# Patient Record
Sex: Male | Born: 1965 | Race: White | Hispanic: No | Marital: Married
Health system: Southern US, Community
[De-identification: ages and names within clinical notes are randomized; demographics above are authoritative.]

---

## 2000-08-10 ENCOUNTER — Ambulatory Visit (HOSPITAL_BASED_OUTPATIENT_CLINIC_OR_DEPARTMENT_OTHER): Admission: RE | Admit: 2000-08-10 | Discharge: 2000-08-10 | Payer: Self-pay | Admitting: Otolaryngology

## 2003-11-23 ENCOUNTER — Ambulatory Visit (HOSPITAL_COMMUNITY): Admission: RE | Admit: 2003-11-23 | Discharge: 2003-11-24 | Payer: Self-pay | Admitting: Otolaryngology

## 2004-03-28 ENCOUNTER — Ambulatory Visit (HOSPITAL_BASED_OUTPATIENT_CLINIC_OR_DEPARTMENT_OTHER): Admission: RE | Admit: 2004-03-28 | Discharge: 2004-03-28 | Payer: Self-pay | Admitting: Otolaryngology

## 2005-01-20 ENCOUNTER — Encounter: Admission: RE | Admit: 2005-01-20 | Discharge: 2005-01-20 | Payer: Self-pay | Admitting: Family Medicine

## 2007-02-21 IMAGING — US US ABDOMEN COMPLETE
1 series · 14 of 25 positions shown · non-contrast
Comparison: none

CLINICAL DATA: Elevated LFTs.
 ULTRASOUND OF THE ABDOMEN:
 Scans over the upper abdomen were performed.  The gallbladder is well seen and no gallstones are noted.  The liver does have increased echogenicity consistent with fatty infiltration.  The common bile duct is normal measuring between 2.3 and 3.8 mm in diameter.  Evaluation of the IVC and pancreas is limited by bowel gas.  The midportion of the pancreas appears normal.  The spleen is normal in size.  No hydronephrosis is seen.  The right kidney measures 11.4 cm sagittally with the left kidney measuring 11.5 cm.  Assessment of the abdominal aorta is limited by bowel gas and patient body habitus.

[Series 1: us abdomen complete · 0.43mm/px · 14 of 64 slices shown]
[im 1/64]
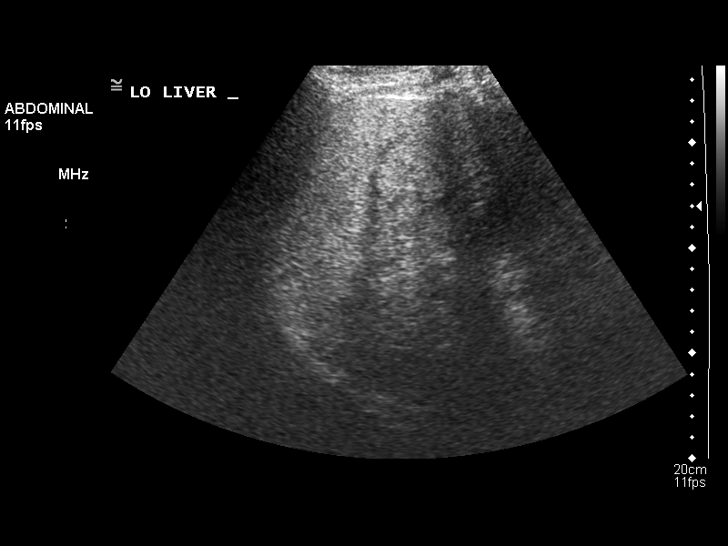
[im 6/64]
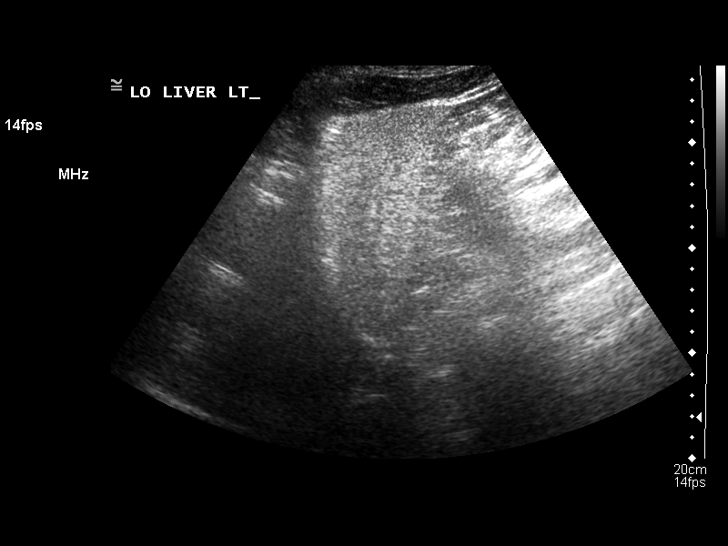
[im 11/64]
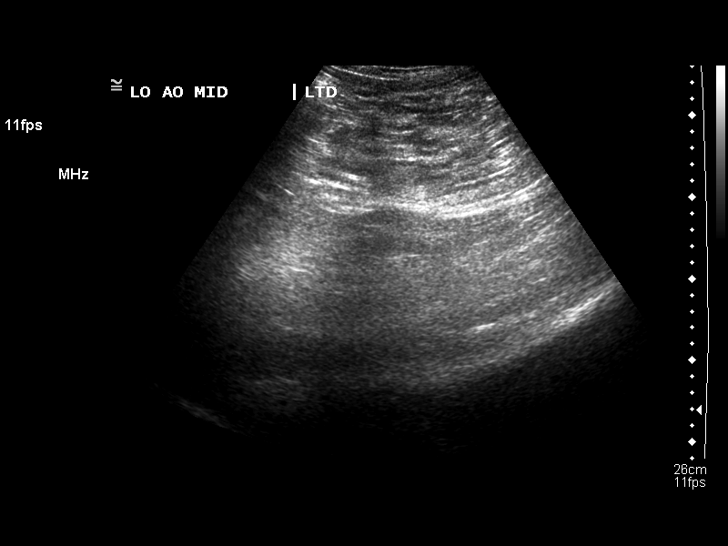
[im 16/64]
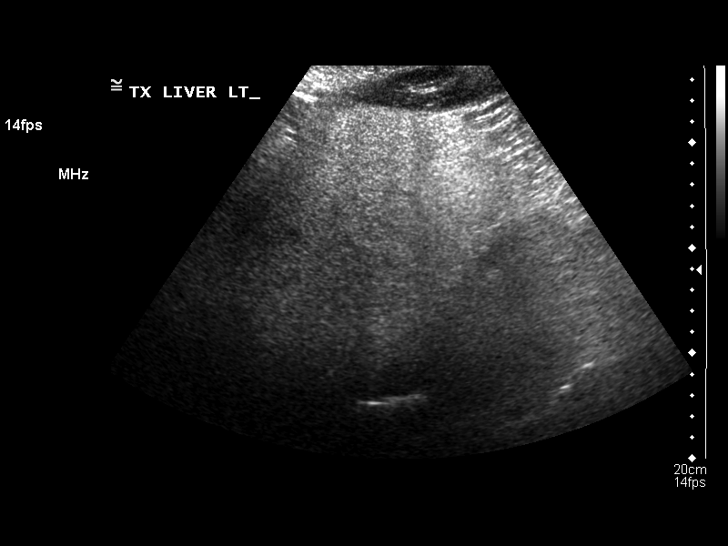
[im 22/64]
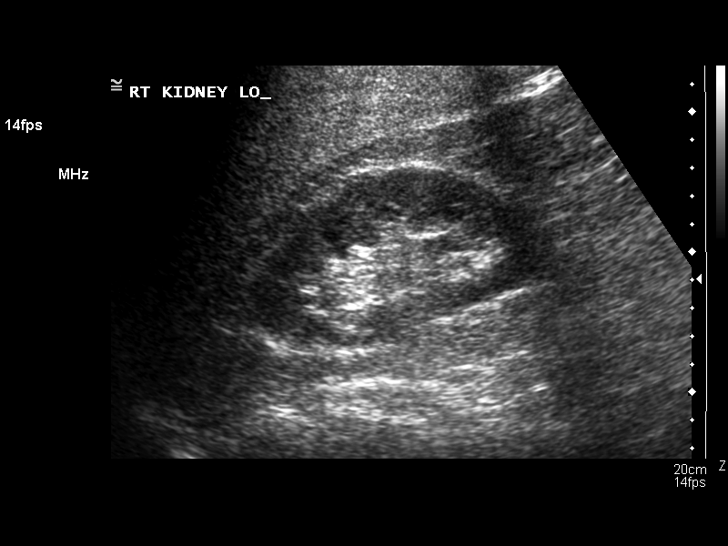
[im 24/64]
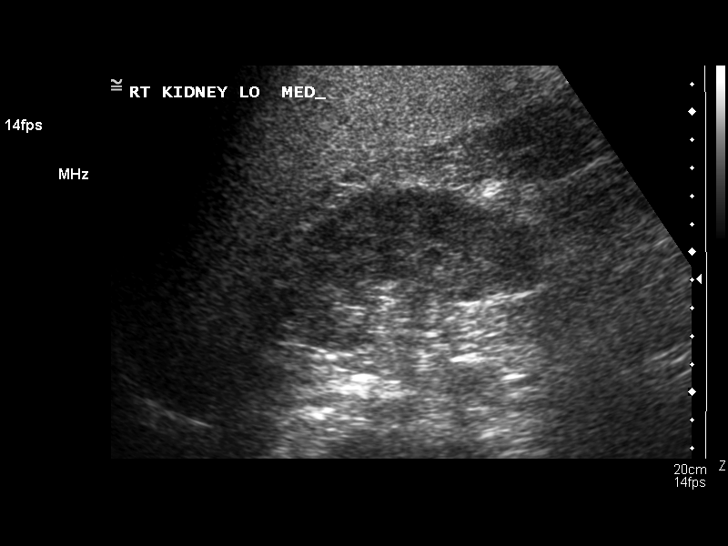
[im 29/64]
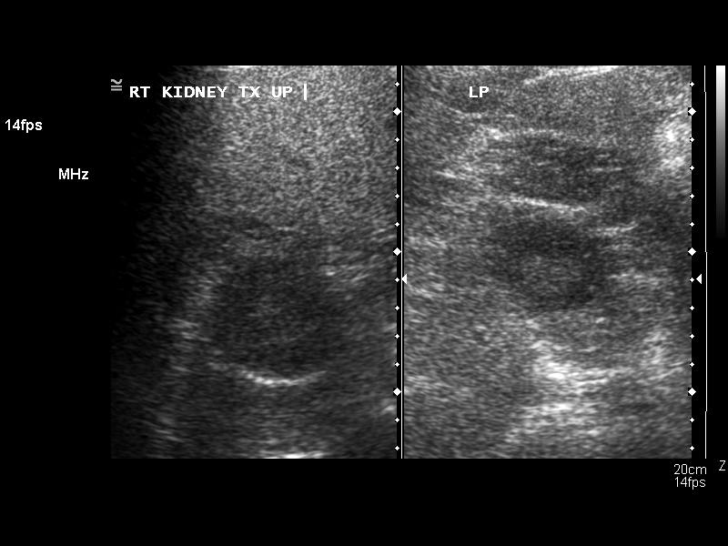
[im 35/64]
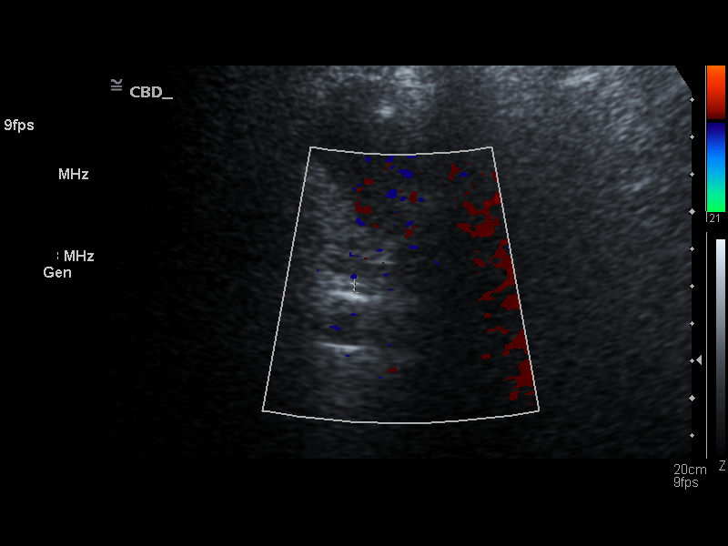
[im 40/64]
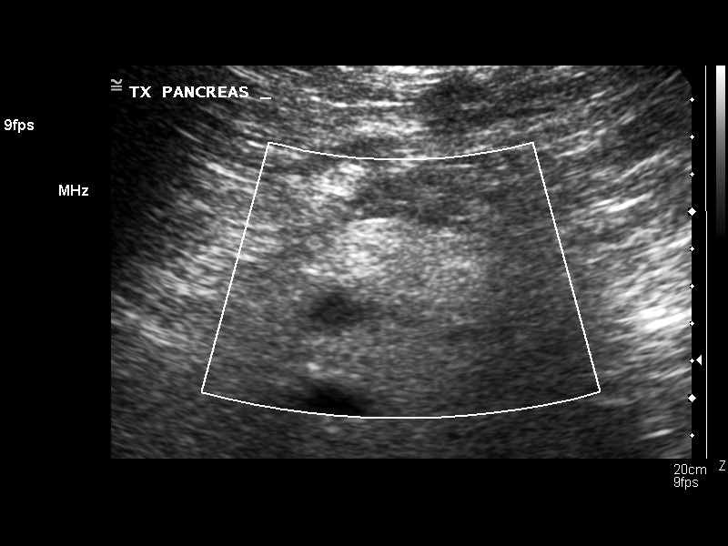
[im 43/64]
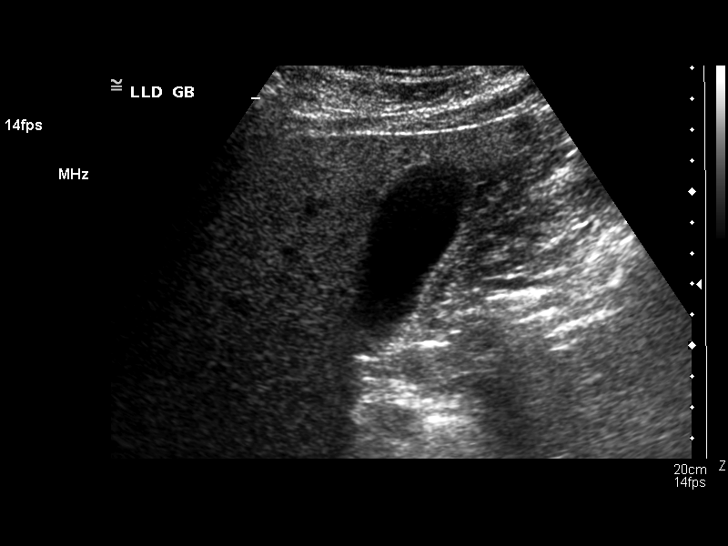
[im 48/64]
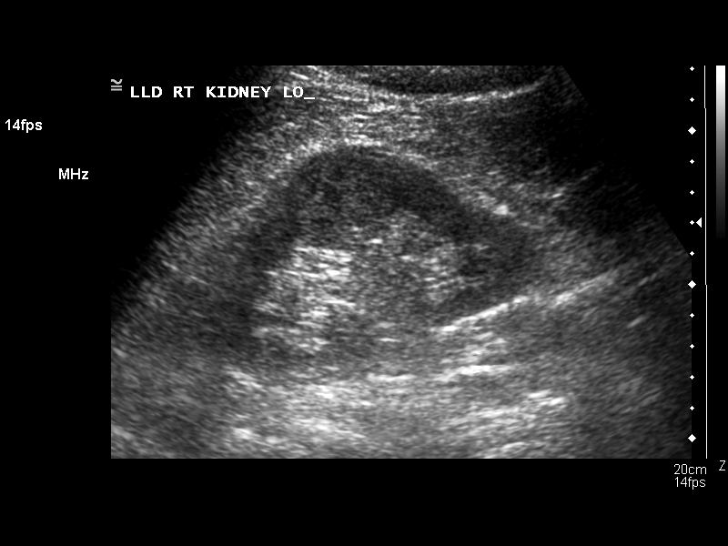
[im 53/64]
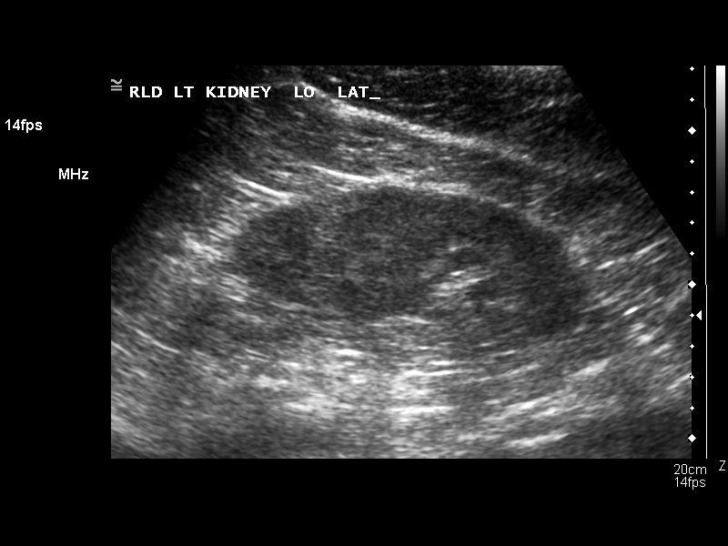
[im 58/64]
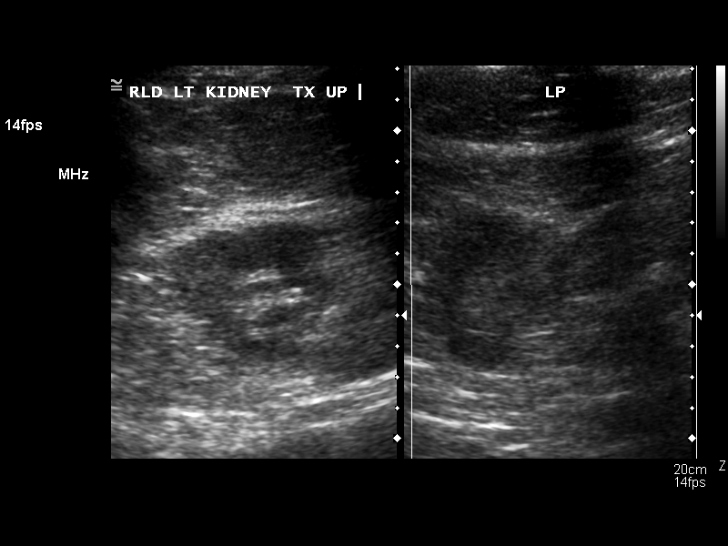
[im 64/64]
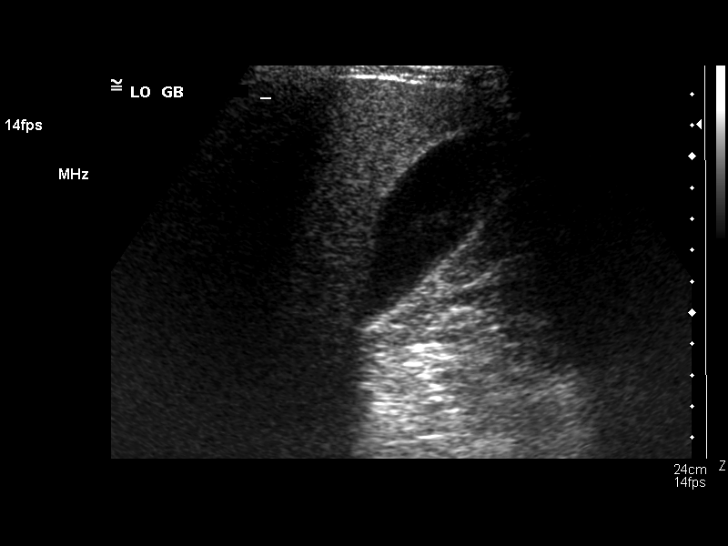

[14 of 25 positions shown; findings below may reference images not displayed]

IMPRESSION: 1.  No gallstones. 
 2.  Fatty infiltration of the liver.
 3.  Bowel gas obscures pancreas and abdominal aorta.

## 2022-08-12 ENCOUNTER — Telehealth: Payer: Self-pay | Admitting: Orthopaedic Surgery

## 2022-08-12 NOTE — Telephone Encounter (Signed)
Alexander Knapp has disc

## 2022-08-12 NOTE — Telephone Encounter (Signed)
Patient left disc at front desk for his appointment on Monday with Dr. Ninfa Linden

## 2022-08-17 ENCOUNTER — Ambulatory Visit: Payer: BC Managed Care – PPO | Admitting: Orthopaedic Surgery

## 2022-08-17 ENCOUNTER — Encounter: Payer: Self-pay | Admitting: Orthopaedic Surgery

## 2022-08-17 DIAGNOSIS — G8929 Other chronic pain: Secondary | ICD-10-CM

## 2022-08-17 DIAGNOSIS — M25562 Pain in left knee: Secondary | ICD-10-CM | POA: Diagnosis not present

## 2022-08-17 MED ORDER — LIDOCAINE HCL 1 % IJ SOLN
3.0000 mL | INTRAMUSCULAR | Status: AC | PRN
  Administered 2022-08-17: 3 mL

## 2022-08-17 MED ORDER — METHYLPREDNISOLONE ACETATE 40 MG/ML IJ SUSP
40.0000 mg | INTRAMUSCULAR | Status: AC | PRN
  Administered 2022-08-17: 40 mg via INTRA_ARTICULAR

## 2022-08-17 NOTE — Progress Notes (Signed)
The patient is a 57 year old gentleman that I am seeing for the first time.  He comes in with left knee pain for about 6 weeks now and some right knee pain.  He does a lot of walking at work and recently was trying to cross the street fast or an area where he wanted to get out of the way of another car and he put some pressure on his knee has been hurting since then.  He points to the medial aspect of his knee as source of his pain.  He has never had surgery on his knee or no specific injury other than recent.  The pain does wake him up at night and it does hurt when he is in bed.  He denies any locking catching.  He does have an x-ray of the knee that accompanies him.  He does report patellofemoral crepitation.  He is not a diabetic.  He has not had any type of therapy or steroid injection either.   Examination of his left knee does show some slight varus malalignment that is correctable.  There is no knee joint effusion but there is medial joint line tenderness.  He also has significant patellofemoral crepitation throughout flexion extension of that knee.  The knee is ligamentously stable.  X-rays of his left knee show what is likely a Baker's cyst in the back in the in terms of calcification that may be a loose body.  There is some slight medial joint space narrowing and patellofemoral narrowing.  There is osteophytes medial and at the patellofemoral joint.  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  I did recommend a steroid injection in his left knee today which she agreed to and tolerated well.  I do feel that he would benefit from outpatient physical therapy to strengthen the muscles around both knees that could help with his mobility.  He agrees with this as well.  We talked about supplements such as turmeric or glucosamine.  I will like to see him back in 4 weeks to see how he is done between the injection and physical therapy.  He agrees  with this treatment plan.  All questions and concerns were answered and addressed.     Procedure Note  Patient: Alexander Knapp             Date of Birth: 1965-12-23           MRN: [id number]             Visit Date: 08/17/2022  Procedures: Visit Diagnoses:  1. Chronic pain of left knee     Large Joint Inj: L knee on 08/17/2022 2:33 PM Indications: diagnostic evaluation and pain Details: 22 G 1.5 in needle, superolateral approach  Arthrogram: No  Medications: 3 mL lidocaine 1 %; 40 mg methylPREDNISolone acetate 40 MG/ML Outcome: tolerated well, no immediate complications Procedure, treatment alternatives, risks and benefits explained, specific risks discussed. Consent was given by the patient. Immediately prior to procedure a time out was called to verify the correct patient, procedure, equipment, support staff and site/side marked as required. Patient was prepped and draped in the usual sterile fashion.

## 2022-08-18 ENCOUNTER — Other Ambulatory Visit: Payer: Self-pay

## 2022-08-18 DIAGNOSIS — G8929 Other chronic pain: Secondary | ICD-10-CM

## 2022-09-16 ENCOUNTER — Other Ambulatory Visit (INDEPENDENT_AMBULATORY_CARE_PROVIDER_SITE_OTHER): Payer: BC Managed Care – PPO

## 2022-09-16 ENCOUNTER — Encounter: Payer: Self-pay | Admitting: Orthopaedic Surgery

## 2022-09-16 ENCOUNTER — Ambulatory Visit: Payer: BC Managed Care – PPO | Admitting: Orthopaedic Surgery

## 2022-09-16 DIAGNOSIS — M79644 Pain in right finger(s): Secondary | ICD-10-CM

## 2022-09-16 MED ORDER — METHYLPREDNISOLONE 4 MG PO TABS: ORAL_TABLET | ORAL | 0 refills | Status: AC

## 2022-09-16 NOTE — Progress Notes (Signed)
The patient is here today for 2 different issues.  I did see him last month for his left knee after an acute event for the knee.  A steroid injection helped his knee felt much better.  He has still some twinges of pain but he says he is not having locking and catching in right down does not need an MRI of the knee.  He is mainly left-hand-dominant but has been having right index finger pain and he points to the [facility] P joint.  He denies any specific injury but has been worse in terms of making a fist on that side.  Examination of his left and right hands were compared.  There is still some slight swelling around the MCP joint of his right hand and there is some pain when I grind the proximal phalanx against the metacarpal head.  There is no subluxating of the extensor tendon and the flexor tendons are also normal and his range of motion is full but is definitely painful around the joint.  Of note his hand is also neurovascular intact.   X-rays of the right hand index finger show just a small osteophyte at the ulnar aspect of the MCP joint of the index finger but otherwise no malalignment or acute findings.  Most likely this is an arthritic process that he is dealing with and I recommended a steroid taper as well as Voltaren gel.  If this worsens he knows to let us know.  I would potentially even send him to my partner Dr. Shon Baton for an ultrasound-guided steroid injection in that right index finger MCP joint.
